# Patient Record
Sex: Male | Born: 1965 | Race: Black or African American | Hispanic: No | Marital: Single
Health system: Midwestern US, Academic
[De-identification: ages and names within clinical notes are randomized; demographics above are authoritative.]

---

## 1998-06-21 ENCOUNTER — Ambulatory Visit (HOSPITAL_COMMUNITY): Admission: RE | Admit: 1998-06-21 | Discharge: 1998-06-21 | Payer: Self-pay | Admitting: *Deleted

## 1998-06-21 ENCOUNTER — Encounter: Payer: Self-pay | Admitting: *Deleted

## 2002-12-31 ENCOUNTER — Ambulatory Visit (HOSPITAL_COMMUNITY): Admission: RE | Admit: 2002-12-31 | Discharge: 2002-12-31 | Payer: Self-pay | Admitting: Family Medicine

## 2007-08-18 ENCOUNTER — Encounter (HOSPITAL_COMMUNITY): Admission: RE | Admit: 2007-08-18 | Discharge: 2007-09-17 | Payer: Self-pay | Admitting: Orthopedic Surgery

## 2013-10-14 MED ORDER — CYCLOBENZAPRINE HCL 10 MG PO TABS
10 MG | ORAL_TABLET | Freq: Three times a day (TID) | ORAL | Status: AC | PRN
Start: 2013-10-14 — End: 2014-10-14

## 2013-10-14 MED ORDER — METHYLPREDNISOLONE (PAK) 4 MG PO TABS
4 MG | ORAL_TABLET | ORAL | Status: DC
Start: 2013-10-14 — End: 2014-02-01

## 2013-10-14 NOTE — Patient Instructions (Signed)
Sciatica: Exercises  Your Care Instructions  Here are some examples of typical rehabilitation exercises for your condition. Start each exercise slowly. Ease off the exercise if you start to have pain.  Your doctor or physical therapist will tell you when you can start these exercises and which ones will work best for you.  When you are not being active, find a comfortable position for rest. Some people are comfortable on the floor or a medium-firm bed with a small pillow under their head and another under their knees. Some people prefer to lie on their side with a pillow between their knees. Don't stay in one position for too long.  Take short walks (10 to 20 minutes) every 2 to 3 hours. Avoid slopes, hills, and stairs until you feel better. Walk only distances you can manage without pain, especially leg pain.  How to do the exercises  Back stretches    1. Get down on your hands and knees on the floor.  2. Relax your head and allow it to droop. Round your back up toward the ceiling until you feel a nice stretch in your upper, middle, and lower back. Hold this stretch for as long as it feels comfortable, or about 15 to 30 seconds.  3. Return to the starting position with a flat back while you are on your hands and knees.  4. Let your back sway by pressing your stomach toward the floor. Lift your buttocks toward the ceiling.  5. Hold this position for 15 to 30 seconds.  6. Repeat 2 to 4 times.  Follow-up care is a key part of your treatment and safety. Be sure to make and go to all appointments, and call your doctor if you are having problems. It's also a good idea to know your test results and keep a list of the medicines you take.   Where can you learn more?   Go to [email or web address] and sign in to your MyChart account. Enter L998 in the Search Health Information box to learn more about "Sciatica: Exercises."    If you do not have an account, please click on the "Sign Up Now" link.       2006-2015 Healthwise, Inc[employer]. Care instructions adapted under license by [facility]. This care instruction is for use with your licensed healthcare professional. If you have questions about a medical condition or this instruction, always ask your healthcare professional. Healthwise, Incorporated disclaims any warranty or liability for your use of this information.  Content Version: 10.6.465758; Current as of: Jun 19, 2013

## 2013-10-14 NOTE — Progress Notes (Signed)
Chief Complaint   Patient presents with   . Established New Doctor   . Lower Back Pain   . Leg Pain     left       HPI: Thomas Stevenson is a 48 yo who presents with 2 1/2 week h/o L sided back pain radiating into his scrotum.  Notes worse  With movement, standing worse, walking worse.  Began after playing basketball.  Notes no incontinence, no dysuria, no weakness or numbness.  Denies specific injury.    He also notes he has mildly elevated blood pressure.  Does not take medication for this and his dad has HTN as well.      ROS: no fever or chills, no headache, no visual changes, no runny nose, no sore throat, no ear pain, no swollen glands or thyroid pain/swelling, no cough, no sob, no chest pain, no palpitation, no abd pain, no n/v/d/c, no urinary frequency or dysuria, no focal weakness or numbness, no joint pain or swelling, + depigmenting rash on skin for 3 years or so, noeasy bleeding or bruising,     No Known Allergies  New Prescriptions    CYCLOBENZAPRINE (FLEXERIL) 10 MG TABLET    Take 1 tablet by mouth every 8 hours as needed for Muscle spasms    METHYLPREDNISOLONE (MEDROL DOSEPACK) 4 MG TABLET    Take 1 tablet by mouth See Admin Instructions Take by mouth as directed.     Current Outpatient Prescriptions   Medication Sig Dispense Refill   . methylPREDNISolone (MEDROL DOSEPACK) 4 MG tablet Take 1 tablet by mouth See Admin Instructions Take by mouth as directed.  21 tablet  0   . cyclobenzaprine (FLEXERIL) 10 MG tablet Take 1 tablet by mouth every 8 hours as needed for Muscle spasms  20 tablet  0     No current facility-administered medications for this visit.       Past Medical History   Diagnosis Date   . Achilles tendon tear      [facility]   . Vitiligo      Past Surgical History   Procedure Laterality Date   . Achilles tendon surgery       Family History   Problem Relation Age of Onset   . Hypertension Father    . Diabetes Father    . Other Mother      History   Substance Use Topics   . Smoking status: Never  Smoker    . Smokeless tobacco: Not on file   . Alcohol Use: 0.0 oz/week     0 Not specified per week      Comment: occassionally       Objective  BP 162/84 mmHg  Pulse 53  Ht  (1.727 m)  Wt 222 lb (100.699 kg)  BMI 33.76 kg/m2  Wt Readings from Last 3 Encounters:   10/14/13 222 lb (100.699 kg)       WDWN in NAD  HEENT: NCAT, PERRL, TM's neg, Canals patent, Nares pink and Patent, Op/OC: pink and patent with good dentition  Neck: no LAD, no TMG, supple and symmetric  Lungs: CTAB BS Equal and Easy, no accessory muscle use  CV: RRR w/o M,R,G, PP2+, no edema  Abd:  BS+, S, ND, NT, no hsm  Neuro:  A&O x 3, CN2-12 grossly intact, Motor/Sensory intact w/o focal deficit, Coordination intact, normal gait  Psych:  Nl Speech and motor activity, no LOA, no FOI, dressed casually in street clothes  Skin:  Depigmenting  rash on face, hands  Musc:  SLR neg B, DTR2+ B prox and distally, motor 5/5 B U/L ext prox and distally      Assessment  Plan    1. Elevated blood pressure reading without diagnosis of hypertension  Will have patient check bp and recheck 1 month  - MyChart Blood Pressure Flowsheet    2. Sciatica neuralgia, left  Will trial steroids, HEP and recheck 1 month  - methylPREDNISolone (MEDROL DOSEPACK) 4 MG tablet; Take 1 tablet by mouth See Admin Instructions Take by mouth as directed.  Dispense: 21 tablet; Refill: 0  - cyclobenzaprine (FLEXERIL) 10 MG tablet; Take 1 tablet by mouth every 8 hours as needed for Muscle spasms  Dispense: 20 tablet; Refill: 0    Thomas Stevenson received counseling on the following healthy behaviors: exercise    Patient given educational materials on Exercise    I have instructed Thomas Stevenson to complete a self tracking handout on Blood Pressures  and instructed them to bring it with them to his next appointment.     Discussed use, benefit, and side effects of prescribed medications.  Barriers to medication compliance addressed.  All patient questions answered.  Pt voiced understanding.     RTC in 1  month

## 2014-02-01 ENCOUNTER — Ambulatory Visit: Admit: 2014-02-01 | Discharge: 2014-02-01 | Payer: PRIVATE HEALTH INSURANCE | Attending: Family Medicine

## 2014-02-01 DIAGNOSIS — J209 Acute bronchitis, unspecified: Secondary | ICD-10-CM

## 2014-02-01 MED ORDER — BENZONATATE 200 MG PO CAPS
200 MG | ORAL_CAPSULE | Freq: Three times a day (TID) | ORAL | Status: AC | PRN
Start: 2014-02-01 — End: 2014-02-08

## 2014-02-01 MED ORDER — METHYLPREDNISOLONE (PAK) 4 MG PO TABS
4 MG | ORAL_TABLET | ORAL | Status: AC
Start: 2014-02-01 — End: ?

## 2014-02-01 MED ORDER — AZITHROMYCIN 250 MG PO TABS
250 MG | PACK | ORAL | Status: AC
Start: 2014-02-01 — End: 2014-02-11

## 2014-02-01 NOTE — Progress Notes (Signed)
Chief Complaint   Patient presents with   . Congestion   . Pharyngitis   . Cough       HPI: Thomas Stevenson presents with 2 week h/o cough, sinus congestion and now has developed sore throat.      ROS: no fever or chills,  no sob, no chest pain, no palpitation,     No Known Allergies  New Prescriptions    AZITHROMYCIN (ZITHROMAX) 250 MG TABLET    As directed    BENZONATATE (TESSALON) 200 MG CAPSULE    Take 1 capsule by mouth every 8 hours as needed for Cough    METHYLPREDNISOLONE (MEDROL DOSEPACK) 4 MG TABLET    Take 1 tablet by mouth See Admin Instructions Take by mouth as directed.     Current Outpatient Prescriptions   Medication Sig Dispense Refill   . azithromycin (ZITHROMAX) 250 MG tablet As directed 1 packet 0   . methylPREDNISolone (MEDROL DOSEPACK) 4 MG tablet Take 1 tablet by mouth See Admin Instructions Take by mouth as directed. 21 tablet 0   . benzonatate (TESSALON) 200 MG capsule Take 1 capsule by mouth every 8 hours as needed for Cough 30 capsule 0   . cyclobenzaprine (FLEXERIL) 10 MG tablet Take 1 tablet by mouth every 8 hours as needed for Muscle spasms 20 tablet 0     No current facility-administered medications for this visit.       Past Medical History   Diagnosis Date   . [facility]   . Vit[facility]igo          Objective  BP 127/86 mmHg  Pulse 64  Temp(Src) 98.7 F (37.1 C) (Oral)  Ht 5\' 8"  (1.727 m)  Wt 221 lb (100.245 kg)  BMI 33.61 kg/m2  Wt Readings from Last 3 Encounters:   02/01/14 221 lb (100.245 kg)   10/14/13 222 lb (100.699 kg)       WDWN in NAD  HEENT: NCAT, PERRL, TM's neg, Canals patent, Nares pink and Patent, Op/OP:  Erythematous w/o exudate with good dentition  Lungs: few rhonchi, BS Equal and Easy, no accessory muscle use  CV: RRR w/o M,R,G, PP2+, no edema      Assessment  Plan    1. Acute bronchitis, unspecified organism  Will tx with abx and recheck inb 1- 2 weeks  - azithromycin (ZITHROMAX) 250 MG tablet; As directed  Dispense: 1 packet; Refill: 0  -  methylPREDNISolone (MEDROL DOSEPACK) 4 MG tablet; Take 1 tablet by mouth See Admin Instructions Take by mouth as directed.  Dispense: 21 tablet; Refill: 0  - benzonatate (TESSALON) 200 MG capsule; Take 1 capsule by mouth every 8 hours as needed for Cough  Dispense: 30 capsule; Refill: 0  Discussed use, benefit, and side effects of prescribed medications.  Barriers to medication compliance addressed.  All patient questions answered.  Pt voiced understanding.   RTC inb 1-2 weeks.

## 2014-02-03 MED ORDER — VALACYCLOVIR HCL 1 G PO TABS
1 g | ORAL_TABLET | Freq: Two times a day (BID) | ORAL | Status: DC
Start: 2014-02-03 — End: 2014-06-04

## 2014-02-03 NOTE — Telephone Encounter (Signed)
Please call:  Done.

## 2014-02-03 NOTE — Telephone Encounter (Signed)
Patient has cold sore. Stated that he usually takes Valtrex that was once prescribed by another physician. Pt would like Valtrex rx'ed to him and sent to [facility] thanks.

## 2014-02-03 NOTE — Telephone Encounter (Signed)
Patient notified

## 2014-06-04 MED ORDER — VALACYCLOVIR HCL 1 G PO TABS
1 g | ORAL_TABLET | Freq: Two times a day (BID) | ORAL | Status: AC
Start: 2014-06-04 — End: 2014-06-14

## 2014-06-04 NOTE — Telephone Encounter (Signed)
Pt called back & [facility] at [phone].

## 2014-06-04 NOTE — Telephone Encounter (Signed)
Please phone in to pharmacy for me.  Thanks.

## 2014-06-04 NOTE — Telephone Encounter (Signed)
Pt called to ask for an Rx for cold sores.  He said he has had this medication before.    [place]  Call pt and let him know.    [facility] [address] [place] [address], phone [phone]

## 2014-06-04 NOTE — Telephone Encounter (Signed)
Tried to call in but that [facility] not [facility] a [facility].  Called pt he will try to locate a pharmacy for us to be able to call this in.

## 2014-11-15 ENCOUNTER — Ambulatory Visit: Admit: 2014-11-15 | Discharge: 2014-11-15 | Payer: PRIVATE HEALTH INSURANCE | Attending: Family Medicine

## 2014-11-15 DIAGNOSIS — M79672 Pain in left foot: Secondary | ICD-10-CM

## 2014-11-15 MED ORDER — METRONIDAZOLE 500 MG PO TABS
500 MG | ORAL_TABLET | Freq: Two times a day (BID) | ORAL | 0 refills | Status: AC
Start: 2014-11-15 — End: 2014-11-22

## 2014-11-15 MED ORDER — CIPROFLOXACIN HCL 500 MG PO TABS
500 MG | ORAL_TABLET | Freq: Two times a day (BID) | ORAL | 0 refills | Status: AC
Start: 2014-11-15 — End: 2014-11-22

## 2014-11-15 NOTE — Patient Instructions (Signed)
Heel Pain: Care Instructions  Your Care Instructions  You can have heel pain from an injury or from everyday overuse, such as running or walking a lot. Plantar fasciitis is the most common cause of heel pain. In this condition, the bottom of your foot from the front of the heel to the base of the toes is sore and hard to walk on.  Your heel can get better with rest, anti-inflammatory pain medicines, and stretching exercises.  Follow-up care is a key part of your treatment and safety. Be sure to make and go to all appointments, and call your doctor if you are having problems. It's also a good idea to know your test results and keep a list of the medicines you take.  How can you care for yourself at home?   Rest your feet often. Reduce your activity to a level that lets you avoid pain. If possible, do not run or walk on hard surfaces.   Take anti-inflammatory medicines to reduce heel pain. These include ibuprofen (Advil, Motrin) and naproxen (Aleve). Read and follow all instructions on the label.   Put ice or a cold pack on your heel for 10 to 20 minutes at a time. Try to do this every 1 to 2 hours for the next 3 days (when you are awake). Put a thin cloth between the ice and your skin.   If ice isn't helping after 2 or 3 days, try heat, such as a heating pad set on low.   If your doctor says it is okay, try these calf stretches. Tight calf muscles can cause heel pain or make it worse.   Stand about 1 foot from a wall. Place the palms of both hands against the wall at chest level and lean forward against the wall. Put the leg you want to stretch about a step behind your other leg. Keep your back heel on the floor and bend your front knee until you feel a stretch in the back leg. Hold this position for 15 to 30 seconds. Repeat the exercise 2 to 4 times a session. Do 3 to 4 sessions a day.   Sit down on the floor or a mat with your feet stretched in front of you. Roll up a towel lengthwise, and loop it over the  ball of your foot. Holding the towel at both ends, gently pull the towel toward you to stretch your foot. Hold this position for 15 to 30 seconds. Repeat the exercise 2 to 4 times a session. Do 3 to 4 sessions a day.   Wear a night splint if your doctor suggests it. A night splint holds your foot with the toes pointed up. This position gives the bottom of your foot a constant, gentle stretch.   Wear shoes with good arch support. Athletic shoes or shoes with a well-cushioned sole are good choices.   Try a heel lift, heel cup or shoe insert (orthotic) to help cushion your heel. You can buy these at many shoe stores. Use them in both shoes, even if only one foot hurts.   Maintain a healthy weight. This puts less strain on your feet.  When should you call for help?  Call your doctor now or seek immediate medical care if:   You have heel pain with fever, redness, or warmth in your heel.   You have numbness or tingling in your heel.  Watch closely for changes in your health, and be sure to contact your doctor if:     You cannot put weight on the sore foot.   Your heel pain lasts more than 2 weeks.  Where can you learn more?  Go to [email or web address] and sign in to your MyChart account. Enter S299 in the Search Health Information box to learn more about "Heel Pain: Care Instructions."    If you do not have an account, please click on the "Sign Up Now" link.   2006-2016 Healthwise, Incorporated. Care instructions adapted under license by [facility]. This care instruction is for use with your licensed healthcare professional. If you have questions about a medical condition or this instruction, always ask your healthcare professional. Healthwise, Incorporated disclaims any warranty or liability for your use of this information.  Content Version: 10.9.538570; Current as of: December 18, 2013

## 2014-11-15 NOTE — Progress Notes (Signed)
Chief Complaint   Patient presents with   . Other     diarrhea after eating   . Foot Pain     left heel       HPI: Arlys JohnBrian rtc with 3 week h/o loose brown liquid stool that has not improved and is associated with gurgling no sig abd pain, no n/v  Notes [place] developed [place].    Notes he also has several week h/o L heel pain worse in the morning, worse with exercise 5/10 and constant through the day. Has tried stretching.      ROS: no fever or chills, no cough, no sob, no chest pain, no palpitation,    No Known Allergies  New Prescriptions    CIPROFLOXACIN (CIPRO) 500 MG TABLET    Take 1 tablet by mouth 2 times daily for 7 days    METRONIDAZOLE (FLAGYL) 500 MG TABLET    Take 1 tablet by mouth 2 times daily for 7 days     Current Outpatient Prescriptions   Medication Sig Dispense Refill   . ciprofloxacin (CIPRO) 500 MG tablet Take 1 tablet by mouth 2 times daily for 7 days 14 tablet 0   . metroNIDAZOLE (FLAGYL) 500 MG tablet Take 1 tablet by mouth 2 times daily for 7 days 14 tablet 0   . methylPREDNISolone (MEDROL DOSEPACK) 4 MG tablet Take 1 tablet by mouth See Admin Instructions Take by mouth as directed. 21 tablet 0     No current facility-administered medications for this visit.        Past Medical History   Diagnosis Date   . [place]      [facility]   . Vitiligo          Objective   Visit Vitals   . BP 127/87 (Site: Right Arm, Position: Sitting, Cuff Size: Large Adult)   . Pulse 54   . Ht 5\' 8"  (1.727 m)   . Wt 219 lb (99.3 kg)   . BMI 33.3 kg/m2     Wt Readings from Last 3 Encounters:   11/15/14 219 lb (99.3 kg)   02/01/14 221 lb (100.2 kg)   10/14/13 222 lb (100.7 kg)       WDWN in NAD  Lungs: CTAB BS Equal and Easy, no accessory muscle use  CV: RRR w/o M,R,G, PP2+, no edema  Abd:  BS+, S, ND, mild LLQ and RLQ tenderness, no hsm  Psych:  Nl Speech and motor activity, no LOA, no FOI, dressed casually in street clothes  Musc:  No tenderness L heel, no discoloration or  swelling.     Assessment   Plan     1. Pain of left heel  Uncertain dx: Plantar fasciitis,  Injury etc, will trial stretching and foot orthotics, rtc inb 2 weeks.     2. Diarrhea of presumed infectious origin  Will trial meds and recheck 1-2 weeks inb sooner if worse.   - ciprofloxacin (CIPRO) 500 MG tablet; Take 1 tablet by mouth 2 times daily for 7 days  Dispense: 14 tablet; Refill: 0  - metroNIDAZOLE (FLAGYL) 500 MG tablet; Take 1 tablet by mouth 2 times daily for 7 days  Dispense: 14 tablet; Refill: 0    Discussed use, benefit, and side effects of prescribed medications.  Barriers to medication compliance addressed.  All patient questions answered.  Pt voiced understanding.       RTC inb 1-2 weeks.

## 2015-06-22 ENCOUNTER — Other Ambulatory Visit: Payer: PRIVATE HEALTH INSURANCE

## 2015-06-22 ENCOUNTER — Ambulatory Visit: Admit: 2015-06-22 | Discharge: 2015-06-22 | Payer: PRIVATE HEALTH INSURANCE

## 2015-06-22 DIAGNOSIS — R9431 Abnormal electrocardiogram [ECG] [EKG]: Secondary | ICD-10-CM

## 2015-06-22 LAB — AMBIG ABBREV LP DEFAULT

## 2015-06-22 LAB — RENAL FUNCTION PANEL W/EGFR
Albumin: 4.4 g/dL (ref 3.5–5.5)
BUN/Creatinine Ratio: 16 (ref 9–20)
BUN: 16 mg/dL (ref 6–24)
CO2: 23 mmol/L (ref 18–29)
Calcium: 9.3 mg/dL (ref 8.7–10.2)
Chloride: 100 mmol/L (ref 96–106)
Creatinine: 1.01 mg/dL (ref 0.76–1.27)
GFR MDRD Af Amer: 101 mL/min/{1.73_m2} (ref >59)
Glucose: 91 mg/dL (ref 65–99)
Phosphorus: 3.3 mg/dL (ref 2.5–4.5)
Potassium: 4.4 mmol/L (ref 3.5–5.2)
Sodium: 140 mmol/L (ref 134–144)
eGFR Non-Afr. American: 88 mL/min/{1.73_m2} (ref >59)

## 2015-06-22 LAB — HEMOGLOBIN A1C: Hemoglobin A1C: 5.8 % (ref 4.8–5.6)

## 2015-06-22 LAB — LIPID PANEL
Cholesterol, Total: 214 mg/dL — ABNORMAL HIGH (ref 100–199)
HDL: 67 mg/dL (ref >39)
LDL Calculated: 132 mg/dL — ABNORMAL HIGH (ref 0–99)
Triglycerides: 76 mg/dL (ref 0–149)
VLDL Cholesterol Cal: 15 mg/dL (ref 5–40)

## 2015-06-22 LAB — TSH: TSH: 1.99 u[IU]/mL (ref 0.450–4.500)

## 2015-06-22 LAB — AMBIG ABBREV RP 10 DEFAULT

## 2015-06-22 NOTE — Unmapped (Signed)
Subjective:       Patient ID: Nicholas Zavala is a 50 y.o. male.    Chief Complaint:    HPI  Middle aged male with no prior sign PMHx who is referred to clinic for an abnormal ECG.  He has no family history of CAD.  He had an ECG as part of a pre-employment work up that was abnormal demonstrating criteria for LVH.  He is active, walking on the treadmill for one hour, three times per week and plays basketball on the weekend.  Today, he is without complaint.    Patient denies chest pain, dyspnea, nausea, vomiting, diaphoresis.  Patient denies palpitations, presyncope.  Patient denies PND, orthopnea, LE edema.      Extensive 11 point review of symptoms reviewed/scanned into record.    DIAGNOSTICS:  No data       Histories:   He has no past medical history on file.    He has no past surgical history on file.    His family history is not on file.    He reports that he has never smoked. He does not have any smokeless tobacco history on file. He reports that he drinks alcohol. He reports that he does not use illicit drugs.    ROS:   ROS    Allergies:   Review of patient's allergies indicates no known allergies.    Medications:     Outpatient Encounter Prescriptions as of 06/22/2015   Medication Sig Dispense Refill   ??? multivitamin capsule Take 1 capsule by mouth daily.       No facility-administered encounter medications on file as of 06/22/2015.        Objective:       Blood pressure 138/90, pulse 52, height 5' 8 (1.727 m), weight 215 lb (97.523 kg), SpO2 98 %.    Physical Exam   Constitutional: He appears healthy. No distress.   HENT:   Nose: Nose normal. No nasal discharge.   Neck: No JVD present.   No HJR  No carotid bruit   Cardiovascular: Normal rate, regular rhythm, S1 normal and S2 normal.   No extrasystoles are present. Exam reveals no gallop.    No murmur heard.  Pulses:       Radial pulses are 2+ on the right side, and 2+ on the left side.        Dorsalis pedis pulses are 2+ on the right side, and 2+ on the left  side.        Posterior tibial pulses are 2+ on the right side, and 2+ on the left side.   Pulmonary/Chest: Breath sounds normal. He has no wheezes. He has no rales. He exhibits no tenderness.   Abdominal: Soft. Bowel sounds are normal. He exhibits no distension. There is no tenderness.   Musculoskeletal: He exhibits no edema.   Neurological: He is alert and oriented to person, place, and time.   Skin: Skin is warm and dry.       Lab Review:   CBC:    No results found for: HGB, HCT, PLT, WBC, MCV, MCH    RENAL:  No results found for: NA, K, CL, CO2, BUN, CREATININE, GLUCOSE, PHOS, ALBUMIN, MICROALBUR, MALB24HUR, CALCIUM    LIVER:    No results found for: AST, ALT, BILITOT, ALBUMIN, ALKPHOS    LIPIDS:    No results found for: CHOLTOT, LDL, HDL, TRIG    THYROID:    No results found for: TSH, FREET4, T3UP  DIABETES:    No results found for: HGBA1C, FRUCT, GLUCOSE, CREATININE    Body mass index is 32.7 kg/(m^2).    The ASCVD Risk score Denman George DC Jr., et al., 2013) failed to calculate for the following reasons:    Cannot find a previous HDL lab    Cannot find a previous total cholesterol lab    EKG Interpretation:  No EKG done at this visit.  ECG from PCP clinic demonstrates sinus bradycardia, LVH with repolarization changes, NSTWC          Assessment:   Middle aged male with no sign PMHx and ECG concerning for LVH.  He does state that his PCP mentioned he has had high pressures in the past.  However, he has never been treated with antihypertensive.  Today, he is hemodynamically stable without complaint.    Plan:   ? HTN/LVH- check echo, keep BP diary; give script for BP monitor  ASCVD risk- check risk stratification labs today  RTC after echo  Noncardiac complaints per PCP    I spent 35 min in direct patient care as well as chart review.

## 2015-07-04 ENCOUNTER — Ambulatory Visit: Admit: 2015-07-04 | Discharge: 2015-07-04 | Payer: PRIVATE HEALTH INSURANCE | Attending: Family Medicine

## 2015-07-04 DIAGNOSIS — R9431 Abnormal electrocardiogram [ECG] [EKG]: Secondary | ICD-10-CM

## 2015-07-04 NOTE — Progress Notes (Signed)
Chief Complaint   Patient presents with   . Other     Per pt had an abnormal EKG at Pre Employment Physical       HPI: Harold HedgeBrian Winget presents for evaluation and management of abnormal ecg.  He notes he got a pre-employment physical with abnormal ECG.  He notes he plays basketball regularly, runs on stair stepper 2-3 x a week for an hour w/o chest pain.  He reports his cholesterol was high on recent physical (LDL 132).  Otherwise feels well.             ROS: no fever or chills, no cough, no sob, no chest pain, no palpitation, no abd pain, no n/v/d/c, no urinary frequency or dysuria,     No Known Allergies  New Prescriptions    No medications on file     Current Outpatient Prescriptions   Medication Sig Dispense Refill   . methylPREDNISolone (MEDROL DOSEPACK) 4 MG tablet Take 1 tablet by mouth See Admin Instructions Take by mouth as directed. 21 tablet 0     No current facility-administered medications for this visit.        Past Medical History:   Diagnosis Date   . [place]     [facility]   . Pure hypercholesterolemia 07/04/2015   . Vitiligo          Objective   BP 139/84  Pulse 50  Resp 20  Ht 5\' 8"  (1.727 m)  Wt 216 lb (98 kg)  BMI 32.84 kg/m2  Wt Readings from Last 3 Encounters:   07/04/15 216 lb (98 kg)   11/15/14 219 lb (99.3 kg)   02/01/14 221 lb (100.2 kg)       WDWN in NAD  Lungs: CTAB BS Equal and Easy, no accessory muscle use  CV: RRR w/o M,R,G, PP2+, no edema  Abd:  BS+, S, ND, NT, no hsm  Psych: Judgement and insight are intact, Nl Speech and motor activity, no LOA, no FOI, dressed casually in street clothes      ECG: NSR w LVH and repolarization ab ant/lat      Chemistry    No results found for: NA, K, CL, CO2, BUN, CREATININE No results found for: CALCIUM, ALKPHOS, AST, ALT, BILITOT       No results found for: WBC, HGB, HCT, MCV, PLT  No results found for: LABA1C  No results found for: EAG  No results found for: LABA1C  No components found for: CHLPL  No results found for: TRIG  No  results found for: HDL  No results found for: LDLCALC  No results found for: LABVLDL      Assessment   Plan     1. Abnormal EKG  Will check Echo for LVH, counseled on BP control and mediteranean diet,  - EKG 12 Lead  - ECHO Complete 2D W Doppler W Color    2. Pure hypercholesterolemia  Work on diet and recheck 3 months.     Arlys JohnBrian received counseling on the following healthy behaviors: nutrition and exercise    Patient given educational materials on Nutrition and Exercise    I have instructed Arlys JohnBrian to complete a self tracking handout on Blood Pressures  and instructed them to bring it with them to his next appointment.     Discussed use, benefit, and side effects of prescribed medications.  Barriers to medication compliance addressed.  All patient questions answered.  Pt voiced understanding.  RTC in 3 month

## 2015-07-05 ENCOUNTER — Inpatient Hospital Stay: Payer: PRIVATE HEALTH INSURANCE

## 2015-07-13 ENCOUNTER — Inpatient Hospital Stay: Admit: 2015-07-13 | Attending: Family Medicine

## 2015-07-13 DIAGNOSIS — R9431 Abnormal electrocardiogram [ECG] [EKG]: Secondary | ICD-10-CM

## 2015-07-13 LAB — ECHOCARDIOGRAM COMPLETE 2D W DOPPLER W COLOR: Left Ventricular Ejection Fraction: 63

## 2015-07-13 NOTE — Progress Notes (Signed)
Call Echocardiogram was normal.

## 2015-07-20 NOTE — Telephone Encounter (Signed)
States pt came in for medical clearance for work, but was sent for an echo first. States office should have received results. Requesting clearance. Please advise. Please call Arlys JohnBrian back at [phone]

## 2015-07-20 NOTE — Telephone Encounter (Signed)
Patient requesting echo results to be faxed over [facility]. Fax number is [phone].

## 2015-07-20 NOTE — Telephone Encounter (Signed)
Faxed as requested

## 2015-07-20 NOTE — Telephone Encounter (Signed)
Called pt & LMOM for him to call office that we have his results & Dr Zachery DauerBarnes can write a letter clearing hi for work if he needs it.

## 2015-07-20 NOTE — Telephone Encounter (Signed)
Please call: His echocardiogram was normal. If he needs a letter please type I upsetting that I will clear him for work without restrictions     thanks

## 2015-07-25 NOTE — Telephone Encounter (Signed)
I refaxed the echo for the patient and called and let him know

## 2015-07-25 NOTE — Telephone Encounter (Signed)
Had echo. States results were supposed to be forwarded to Dr Derl Barrowobert Hollis. Their office is stating they never received the results. Pt is calling to find out if this can be done. Please advise. Please call Arlys JohnBrian back at [phone]-- states he provided all information to have results faxed with last phone call

## 2016-05-21 ENCOUNTER — Encounter: Payer: Self-pay | Admitting: Podiatry

## 2016-05-21 ENCOUNTER — Ambulatory Visit (INDEPENDENT_AMBULATORY_CARE_PROVIDER_SITE_OTHER): Payer: 59

## 2016-05-21 ENCOUNTER — Ambulatory Visit: Payer: 59

## 2016-05-21 ENCOUNTER — Ambulatory Visit (INDEPENDENT_AMBULATORY_CARE_PROVIDER_SITE_OTHER): Payer: 59 | Admitting: Podiatry

## 2016-05-21 VITALS — BP 135/86 | HR 51

## 2016-05-21 DIAGNOSIS — S93692A Other sprain of left foot, initial encounter: Secondary | ICD-10-CM | POA: Diagnosis not present

## 2016-05-21 DIAGNOSIS — R52 Pain, unspecified: Secondary | ICD-10-CM

## 2016-05-21 MED ORDER — MELOXICAM 15 MG PO TABS: 15.0000 mg | ORAL_TABLET | Freq: Every day | ORAL | 2 refills | Status: AC

## 2016-05-21 MED ORDER — MELOXICAM 15 MG PO TABS: 15.0000 mg | ORAL_TABLET | Freq: Every day | ORAL | 2 refills | Status: DC

## 2016-05-21 MED ORDER — HYDROCODONE-ACETAMINOPHEN 5-325 MG PO TABS: 1.0000 | ORAL_TABLET | Freq: Four times a day (QID) | ORAL | 0 refills | Status: DC | PRN

## 2016-05-21 NOTE — Progress Notes (Signed)
   Subjective:    Patient ID: Jermaine Martin, male    DOB: [birth year], 51 y.o.   MRN: [id number]  HPI 51 year old male presents the office today for concerns of left foot injury. He states his playing basketball yesterday am ongoing across the Court he felt a sudden pain in the bottom of his heel on his left side. He states he is history of right Achilles rupture but just did not feel like that. He states that immediately he was unable to wait to the bottom of his foot. He denies any sudden impact. He's been using crutches as His foot. He is a no other treatment. No other complaints.   Review of Systems  All other systems reviewed and are negative.      Objective:   Physical Exam General: AAO x3, NAD  Dermatological: Skin is warm, dry and supple bilateral. Nails x 10 are well manicured; remaining integument appears unremarkable at this time. There are no open sores, no preulcerative lesions, no rash or signs of infection present.  Vascular: Dorsalis Pedis artery and Posterior Tibial artery pedal pulses are 2/4 bilateral with immedate capillary fill time. There is no pain with calf compression, swelling, warmth, erythema.   Neruologic: Grossly intact via light touch bilateral. Vibratory intact via tuning fork bilateral. Protective threshold with Semmes Wienstein monofilament intact to all pedal sites bilateral. Negative Tinel's sign.  Musculoskeletal: There is tenderness to palpation on the medial band plantar fascia at the insertion of the calcaneus. There is mild edema to the area. I'm able to palpate fully the plantar fascia. This could be due to partial rupture versus swelling. Is no erythema or increase in warmth is no lateral compression pain of the calcaneus. There is no pain along the course or insertion of the Achilles tendon and Thompson's test is negative. There is no other areas of tenderness identified bilaterally. Muscular strength 5/5 in all groups tested bilateral.  Gait:  Unassisted, Nonantalgic.     Assessment & Plan:  51 year old male left foot likely plantar fascial rupture -Treatment options discussed including all alternatives, risks, and complications -Etiology of symptoms were discussed -X-rays were obtained and reviewed with the patient. No evidence of acute fracture identified. -Cam boot was dispensed. Once all times. -He has crutches. Remain nonweightbearing to is able to put weight on his foot. -Ice and elevation -No exercise -Rx Vicodin -RTC 2 weeks. If not improving will order an MRI  Ovid Curd, DPM

## 2016-06-04 ENCOUNTER — Encounter: Payer: Self-pay | Admitting: Podiatry

## 2016-06-04 ENCOUNTER — Ambulatory Visit (INDEPENDENT_AMBULATORY_CARE_PROVIDER_SITE_OTHER): Payer: 59 | Admitting: Podiatry

## 2016-06-04 DIAGNOSIS — S93692D Other sprain of left foot, subsequent encounter: Secondary | ICD-10-CM

## 2016-06-04 NOTE — Progress Notes (Signed)
Subjective: 51 year old male presents the office today for follow-up evaluation of left foot plantar fasciitis rupture. He states he is doing better and is able to walk in the boot these been tried going for distances without the boot and doing better. His pain level maxes 5/10. He is asking to return to work this week. Denies any systemic complaints such as fevers, chills, nausea, vomiting. No acute changes since last appointment, and no other complaints at this time.   Objective: AAO x3, NAD DP/PT pulses palpable bilaterally, CRT less than 3 seconds There is minimal discomfort along the medial band of plantar fascia within the arch of the foot and this is significantly improved compared to what it was last appointment. There is no pain with lateral compression of the calcaneus. There is no pain on the Achilles tendon and Thompson test negative. There is no other areas of tenderness. There is no overlying erythema or increase in warmth. No open lesions or pre-ulcerative lesions.  No pain with calf compression, swelling, warmth, erythema  Assessment: Left foot partial plantar fascial tear likely.  Plan: -All treatment options discussed with the patient including all alternatives, risks, complications.  -This time his symptoms are improved. We will try to return to regular shoe and I dispensed plantar fascial brace to see how this will help as well. Also reconstruct the wear drainage shoe and he can return to work this week if able. However if he goes back to work Ms. pain he is to remain in a cam boot unfortunately cannot work in the boot however. -Continue ice as well as plantar fascial stretching exercises. -Follow-up 2 weeks or sooner if needed. If symptoms continue steroid injection at next appointment likely.  -Patient encouraged to call the office with any questions, concerns, change in symptoms.   Ovid CurdMatthew Fernanda Twaddell, DPM

## 2016-06-05 ENCOUNTER — Telehealth: Payer: Self-pay | Admitting: *Deleted

## 2016-06-05 NOTE — Telephone Encounter (Addendum)
Pt states he saw Dr. Ardelle AntonWagoner 06/04/2016, and needs a return to work note 06/07/2016 and the status for faxed paperwork for job description and restrictions. Left message informing pt the letter would be available for fax or to be picked up in the [place] office.

## 2016-06-06 ENCOUNTER — Encounter: Payer: Self-pay | Admitting: *Deleted

## 2016-06-06 NOTE — Telephone Encounter (Signed)
He can return to work in a regular shoe. Please allow him to sit and ice his foot when needed.

## 2016-06-08 ENCOUNTER — Telehealth: Payer: Self-pay | Admitting: Podiatry

## 2016-06-08 NOTE — Telephone Encounter (Signed)
Patient called about job description paperwork. Says its supposed to faxed to his job but they are stating they never got it. Left a new fax number and contact to fax it to. That information is: fax number [phone]  Attn: Marlana SalvageLisa Shuller

## 2016-06-21 ENCOUNTER — Ambulatory Visit: Payer: 59 | Admitting: Podiatry

## 2017-02-13 ENCOUNTER — Ambulatory Visit: Payer: 59 | Admitting: Urology

## 2017-02-13 DIAGNOSIS — N5201 Erectile dysfunction due to arterial insufficiency: Secondary | ICD-10-CM | POA: Diagnosis not present

## 2017-02-13 DIAGNOSIS — Z308 Encounter for other contraceptive management: Secondary | ICD-10-CM | POA: Diagnosis not present

## 2017-10-02 ENCOUNTER — Ambulatory Visit: Payer: 59 | Admitting: Urology

## 2020-06-03 ENCOUNTER — Emergency Department (HOSPITAL_COMMUNITY)
Admission: EM | Admit: 2020-06-03 | Discharge: 2020-06-04 | Disposition: A | Discharge status: Home / Self Care | Payer: 59 | Attending: Emergency Medicine | Admitting: Emergency Medicine

## 2020-06-03 ENCOUNTER — Encounter (HOSPITAL_COMMUNITY): Payer: Self-pay | Admitting: Emergency Medicine

## 2020-06-03 ENCOUNTER — Emergency Department (HOSPITAL_COMMUNITY): Payer: 59

## 2020-06-03 DIAGNOSIS — R1031 Right lower quadrant pain: Secondary | ICD-10-CM | POA: Diagnosis present

## 2020-06-03 DIAGNOSIS — R110 Nausea: Secondary | ICD-10-CM | POA: Insufficient documentation

## 2020-06-03 DIAGNOSIS — N200 Calculus of kidney: Secondary | ICD-10-CM

## 2020-06-03 LAB — COMPREHENSIVE METABOLIC PANEL
ALT: 27 U/L (ref 0–44)
AST: 28 U/L (ref 15–41)
Albumin: 4.2 g/dL (ref 3.5–5.0)
Alkaline Phosphatase: 87 U/L (ref 38–126)
Anion gap: 8 (ref 5–15)
BUN: 13 mg/dL (ref 6–20)
CO2: 26 mmol/L (ref 22–32)
Calcium: 9.6 mg/dL (ref 8.9–10.3)
Chloride: 105 mmol/L (ref 98–111)
Creatinine, Ser: 1.39 mg/dL — ABNORMAL HIGH (ref 0.61–1.24)
GFR, Estimated: >60 mL/min (ref >60)
Glucose, Bld: 139 mg/dL — ABNORMAL HIGH (ref 70–99)
Potassium: 4 mmol/L (ref 3.5–5.1)
Sodium: 139 mmol/L (ref 135–145)
Total Bilirubin: 0.7 mg/dL (ref 0.3–1.2)
Total Protein: 7 g/dL (ref 6.5–8.1)

## 2020-06-03 LAB — CBC WITH DIFFERENTIAL/PLATELET
Abs Immature Granulocytes: 0.04 10*3/uL (ref 0.00–0.07)
Basophils Absolute: 0 10*3/uL (ref 0.0–0.1)
Basophils Relative: 0 %
Eosinophils Absolute: 0 10*3/uL (ref 0.0–0.5)
Eosinophils Relative: 0 %
HCT: 44.4 % (ref 39.0–52.0)
Hemoglobin: 14.1 g/dL (ref 13.0–17.0)
Immature Granulocytes: 1 %
Lymphocytes Relative: 9 %
Lymphs Abs: 0.7 10*3/uL (ref 0.7–4.0)
MCH: 27.8 pg (ref 26.0–34.0)
MCHC: 31.8 g/dL (ref 30.0–36.0)
MCV: 87.4 fL (ref 80.0–100.0)
Monocytes Absolute: 0.4 10*3/uL (ref 0.1–1.0)
Monocytes Relative: 5 %
Neutro Abs: 6.8 10*3/uL (ref 1.7–7.7)
Neutrophils Relative %: 85 %
Platelets: 146 10*3/uL — ABNORMAL LOW (ref 150–400)
RBC: 5.08 MIL/uL (ref 4.22–5.81)
RDW: 13.8 % (ref 11.5–15.5)
WBC: 7.9 10*3/uL (ref 4.0–10.5)
nRBC: 0 % (ref 0.0–0.2)

## 2020-06-03 LAB — URINALYSIS, ROUTINE W REFLEX MICROSCOPIC
Bilirubin Urine: NEGATIVE
Glucose, UA: NEGATIVE mg/dL
Hgb urine dipstick: NEGATIVE
Ketones, ur: 20 mg/dL — AB
Leukocytes,Ua: NEGATIVE
Nitrite: NEGATIVE
Protein, ur: NEGATIVE mg/dL
Specific Gravity, Urine: 1.025 (ref 1.005–1.030)
pH: 7 (ref 5.0–8.0)

## 2020-06-03 LAB — LIPASE, BLOOD: Lipase: 27 U/L (ref 11–51)

## 2020-06-03 MED ORDER — SODIUM CHLORIDE 0.9 % IV BOLUS
1000.0000 mL | Freq: Once | INTRAVENOUS | Status: AC
  Administered 2020-06-03: 1000 mL via INTRAVENOUS

## 2020-06-03 MED ORDER — KETOROLAC TROMETHAMINE 30 MG/ML IJ SOLN
30.0000 mg | Freq: Once | INTRAMUSCULAR | Status: AC
  Administered 2020-06-03: 30 mg via INTRAVENOUS
  Filled 2020-06-03: qty 1

## 2020-06-03 MED ORDER — OXYCODONE-ACETAMINOPHEN 5-325 MG PO TABS
1.0000 | ORAL_TABLET | Freq: Once | ORAL | Status: AC
  Administered 2020-06-03: 1 via ORAL
  Filled 2020-06-03: qty 1

## 2020-06-03 MED ORDER — TAMSULOSIN HCL 0.4 MG PO CAPS: 0.4000 mg | ORAL_CAPSULE | Freq: Every day | ORAL | 0 refills | Status: AC

## 2020-06-03 MED ORDER — TAMSULOSIN HCL 0.4 MG PO CAPS: 0.4000 mg | ORAL_CAPSULE | Freq: Every day | ORAL | 0 refills | Status: DC

## 2020-06-03 MED ORDER — HYDROCODONE-ACETAMINOPHEN 5-325 MG PO TABS: 1.0000 | ORAL_TABLET | Freq: Four times a day (QID) | ORAL | 0 refills | Status: AC | PRN

## 2020-06-03 MED ORDER — ONDANSETRON 4 MG PO TBDP: 4.0000 mg | ORAL_TABLET | Freq: Three times a day (TID) | ORAL | 0 refills | Status: DC | PRN

## 2020-06-03 MED ORDER — ONDANSETRON 4 MG PO TBDP: 4.0000 mg | ORAL_TABLET | Freq: Three times a day (TID) | ORAL | 0 refills | Status: AC | PRN

## 2020-06-03 MED ORDER — IOHEXOL 300 MG/ML  SOLN
75.0000 mL | Freq: Once | INTRAMUSCULAR | Status: AC | PRN
  Administered 2020-06-03: 75 mL via INTRAVENOUS

## 2020-06-03 MED ORDER — ONDANSETRON 4 MG PO TBDP
4.0000 mg | ORAL_TABLET | Freq: Once | ORAL | Status: AC
  Administered 2020-06-03: 4 mg via ORAL
  Filled 2020-06-03: qty 1

## 2020-06-03 NOTE — ED Provider Notes (Signed)
Emergency Medicine Provider Triage Evaluation Note  Jermaine Martin , a 55 y.o. male  was evaluated in triage.  Pt complains of right lower quadrant abdominal pain.  Symptoms started today after he had been at work for a few hours.  Associated nausea and dry heaving.  No hematuria, dysuria or flank pain.  No prior abdominal surgeries  Review of Systems  Positive: Abdominal pain, nausea, vomiting Negative: Fever, hematuria, flank pain, chest pain  Physical Exam  BP (!) 160/100 (BP Location: Right Arm)   Pulse (!) 58   Temp 98.4 F (36.9 C) (Oral)   Resp (!) 24   SpO2 100%  Gen:   Awake, appears very uncomfortable Resp:  Normal effort  MSK:   Moves extremities without difficulty  Other:  Tenderness present in the right lower quadrant without guarding  Medical Decision Making  Medically screening exam initiated at 1:34 PM.  Appropriate orders placed.  Jermaine Martin was informed that the remainder of the evaluation will be completed by another provider, this initial triage assessment does not replace that evaluation, and the importance of remaining in the ED until their evaluation is complete.     Jermaine Lodge, PA-C 06/03/20 1350    Jermaine Core, MD 06/03/20 [phone]

## 2020-06-03 NOTE — ED Provider Notes (Signed)
[facility] EMERGENCY DEPARTMENT Provider Note   CSN: [id number] Arrival date & time: 06/03/20  1315     History No chief complaint on file.   Jermaine Martin is a 55 y.o. male.  HPI    Patient presents with concern of right lower quadrant abdominal pain, nausea, retching. Patient is generally well, has no history of abdominal surgery, no history of kidney or bladder issues. Onset was within the past 15 hours.  No clear precipitant. Initially pain was severe, but it is improved somewhat without clear intervention.  Pain is focal, minimally radiating, sharp. There is been persistent and anorexia, and retching, but no additional vomiting after onset.  History reviewed. No pertinent past medical history.  There are no problems to display for this patient.   History reviewed. No pertinent surgical history.     History reviewed. No pertinent family history.  Social History   Tobacco Use  . Smoking status: Never Smoker  . Smokeless tobacco: Never Used  Substance Use Topics  . Alcohol use: Yes    Comment: occas.  . Drug use: No    Home Medications Prior to Admission medications   Medication Sig Start Date End Date Taking? Authorizing Provider  HYDROcodone-acetaminophen (NORCO/VICODIN) 5-325 MG tablet Take 1 tablet by mouth every 6 (six) hours as needed. 05/21/16   Vivi Barrack, DPM    Allergies    Patient has no known allergies.  Review of Systems   Review of Systems  Constitutional:       Per HPI, otherwise negative  HENT:       Per HPI, otherwise negative  Respiratory:       Per HPI, otherwise negative  Cardiovascular:       Per HPI, otherwise negative  Gastrointestinal: Positive for abdominal pain, nausea and vomiting.  Endocrine:       Negative aside from HPI  Genitourinary:       Neg aside from HPI   Musculoskeletal:       Per HPI, otherwise negative  Skin: Negative.   Neurological: Negative for syncope.    Physical  Exam Updated Vital Signs BP (!) 166/92 (BP Location: Left Arm)   Pulse 83   Temp 98.4 F (36.9 C) (Oral)   Resp 18   SpO2 100%   Physical Exam Vitals and nursing note reviewed.  Constitutional:      General: He is not in acute distress.    Appearance: He is well-developed.  HENT:     Head: Normocephalic and atraumatic.  Eyes:     Conjunctiva/sclera: Conjunctivae normal.  Cardiovascular:     Rate and Rhythm: Normal rate and regular rhythm.  Pulmonary:     Effort: Pulmonary effort is normal. No respiratory distress.     Breath sounds: No stridor.  Abdominal:     General: There is no distension.     Tenderness: There is abdominal tenderness.  Skin:    General: Skin is warm and dry.  Neurological:     Mental Status: He is alert and oriented to person, place, and time.     ED Results / Procedures / Treatments   Labs (all labs ordered are listed, but only abnormal results are displayed) Labs Reviewed  COMPREHENSIVE METABOLIC PANEL - Abnormal; Notable for the following components:      Result Value   Glucose, Bld 139 (*)    Creatinine, Ser 1.39 (*)    All other components within normal limits  CBC WITH DIFFERENTIAL/PLATELET -  Abnormal; Notable for the following components:   Platelets 146 (*)    All other components within normal limits  URINALYSIS, ROUTINE W REFLEX MICROSCOPIC - Abnormal; Notable for the following components:   Ketones, ur 20 (*)    All other components within normal limits  LIPASE, BLOOD    EKG None  Radiology CT Abdomen Pelvis W Contrast  Result Date: 06/03/2020 CLINICAL DATA:  RIGHT lower quadrant pain beginning today, associated nausea and dry heaving, question appendicitis EXAM: CT ABDOMEN AND PELVIS WITH CONTRAST TECHNIQUE: Multidetector CT imaging of the abdomen and pelvis was performed using the standard protocol following bolus administration of intravenous contrast. Sagittal and coronal MPR images reconstructed from axial data set.  CONTRAST:  60mL OMNIPAQUE IOHEXOL 300 MG/ML SOLN IV. No oral contrast. COMPARISON:  None FINDINGS: Lower chest: Lung bases clear Hepatobiliary: Gallbladder and liver normal appearance Pancreas: Normal appearance Spleen: Normal appearance Adrenals/Urinary Tract: Adrenal glands normal appearance. Minimal RIGHT renal enlargement with delay in RIGHT nephrogram and extensive perinephric edema. Dilated RIGHT ureter extending to a 2 mm calculus at the ureterovesical junction. Small cyst and tiny nonobstructing calculus LEFT kidney. No LEFT hydronephrosis or ureteral dilatation. Bladder otherwise unremarkable. Stomach/Bowel: Normal appendix. Stomach and bowel loops normal appearance. Vascular/Lymphatic: Vascular structures patent.  No adenopathy. Reproductive: Unremarkable prostate gland and seminal vesicles Other: No free air or free fluid.  No hernia. Musculoskeletal: Unremarkable IMPRESSION: RIGHT hydronephrosis and hydroureter secondary to a 2 mm RIGHT ureterovesical junction calculus, with associated delay in RIGHT nephrogram and significant RIGHT perinephric edema. Additional tiny nonobstructing LEFT renal calculus and small LEFT renal cyst. Normal appendix. Remainder of exam unremarkable. Electronically Signed   By: Ulyses Southward M.D.   On: 06/03/2020 19:16    Procedures Procedures   Medications Ordered in ED Medications  oxyCODONE-acetaminophen (PERCOCET/ROXICET) 5-325 MG per tablet 1 tablet (1 tablet Oral Given 06/03/20 1353)  ondansetron (ZOFRAN-ODT) disintegrating tablet 4 mg (4 mg Oral Given 06/03/20 1353)  iohexol (OMNIPAQUE) 300 MG/ML solution 75 mL (75 mLs Intravenous Contrast Given 06/03/20 1850)  sodium chloride 0.9 % bolus 1,000 mL (1,000 mLs Intravenous New Bag/Given 06/03/20 2116)  ketorolac (TORADOL) 30 MG/ML injection 30 mg (30 mg Intravenous Given 06/03/20 2116)    ED Course  I have reviewed the triage vital signs and the nursing notes.  Pertinent labs & imaging results that were available  during my care of the patient were reviewed by me and considered in my medical decision making (see chart for details).   After the initial evaluation I reviewed the patient's CT images and then demonstrated them to him in the room, specifically illustrating the presence of a right-sided kidney stone.  10:20 PM Patient smiling, has been tolerant of oral intake, feels better. He and I again discussed findings, kidney stone, slight dehydration reflected with elevated creatinine, ketone urea.  No evidence for concurrent infection, complete obstruction.  Patient amenable to, appropriate for discharge with outpatient urology follow-up.  Final Clinical Impression(s) / ED Diagnoses Final diagnoses:  Nephrolithiasis    Rx / DC Orders ED Discharge Orders         Ordered    HYDROcodone-acetaminophen (NORCO/VICODIN) 5-325 MG tablet  Every 6 hours PRN        06/03/20 2223    ondansetron (ZOFRAN ODT) 4 MG disintegrating tablet  Every 8 hours PRN        06/03/20 2223    tamsulosin (FLOMAX) 0.4 MG CAPS capsule  Daily        06/03/20  2223           Gerhard Munch, MD 06/03/20 2224

## 2020-06-03 NOTE — Discharge Instructions (Signed)
In addition to the prescribed medication, please use ibuprofen, 400 mg, 3 times daily for additional relief.  Follow-up with our urology colleagues or return here for concerning changes in your condition.

## 2020-06-03 NOTE — ED Triage Notes (Signed)
Pt here from work with c/o right lower side abds pain  Along with some vomiting

## 2022-05-03 IMAGING — CT CT ABD-PELV W/ CM
2 of 5 series · 17 of 46 positions shown, 19 images · IV contrast (Omni 300)
Comparison: None

CLINICAL DATA: RIGHT lower quadrant pain beginning today,
associated nausea and dry heaving, question appendicitis

EXAM:
CT ABDOMEN AND PELVIS WITH CONTRAST
TECHNIQUE: Multidetector CT imaging of the abdomen and pelvis was performed
using the standard protocol following bolus administration of
intravenous contrast. Sagittal and coronal MPR images reconstructed
from axial data set.
CONTRAST:  75mL OMNIPAQUE IOHEXOL 300 MG/ML SOLN IV. No oral
contrast.

[Series 3: a/p w/ 5mm · axial · 0.96mm/px · z∈[+765,+1215]mm · 14 of 101 slices shown, 16 images]
[im 6/101  soft-tissue]
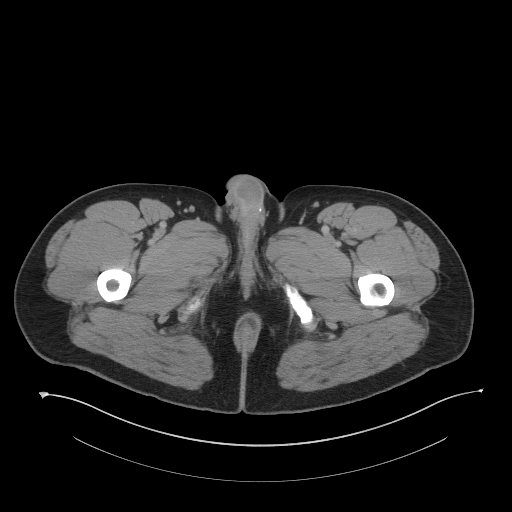
[im 6/101  bone]
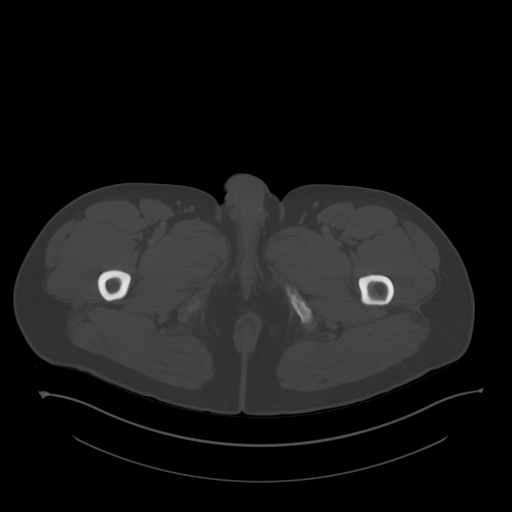
[im 16/101  soft-tissue]
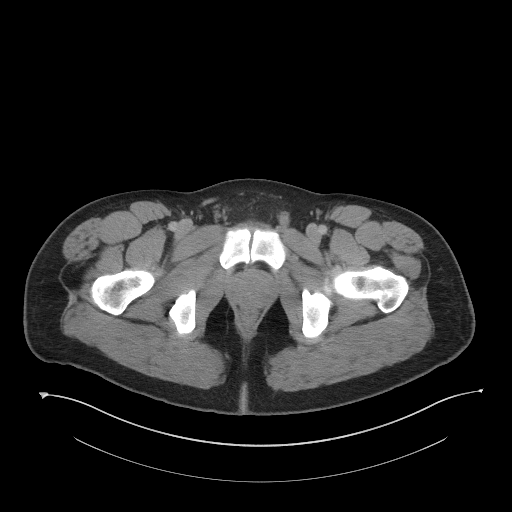
[im 21/101  soft-tissue]
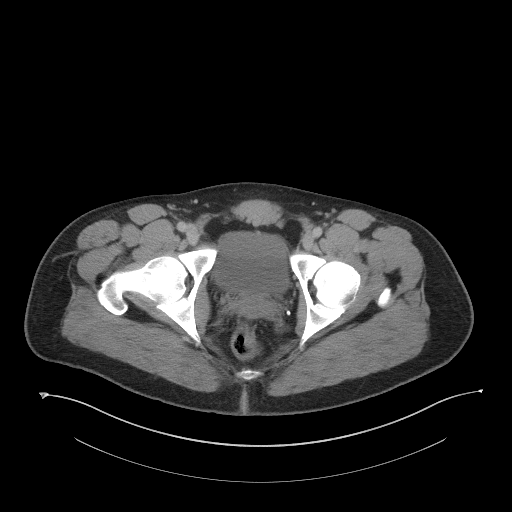
[im 26/101  soft-tissue]
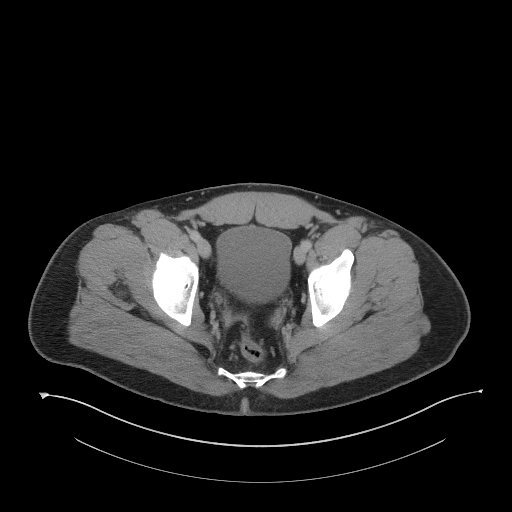
[im 36/101  soft-tissue]
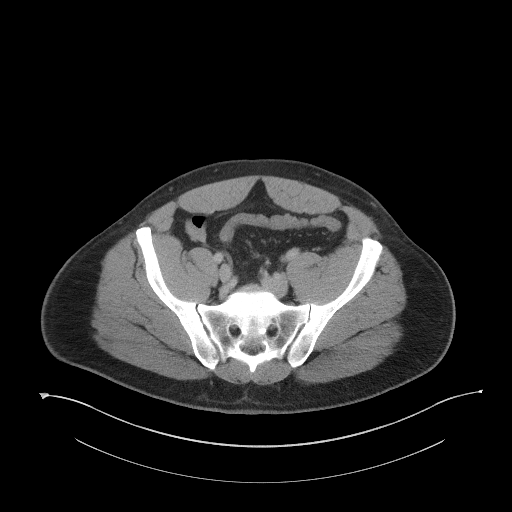
[im 41/101  soft-tissue]
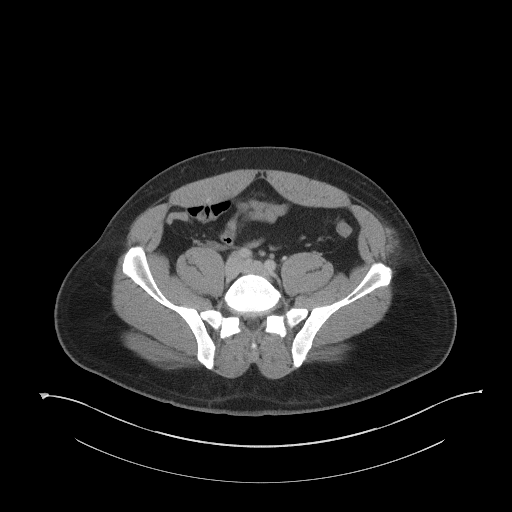
[im 46/101  soft-tissue]
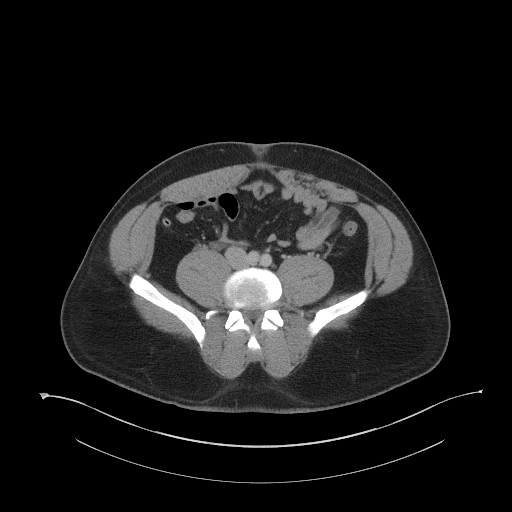
[im 56/101  soft-tissue]
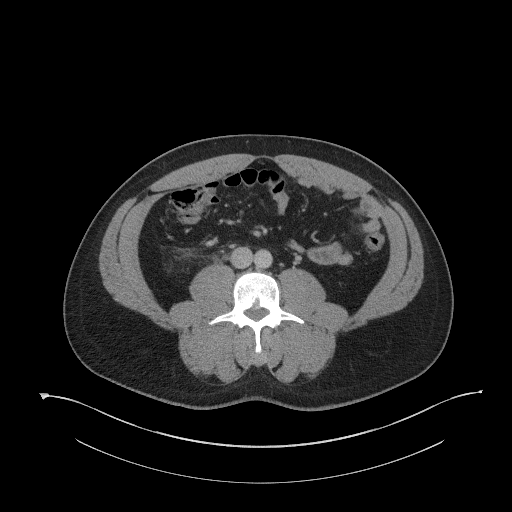
[im 61/101  soft-tissue]
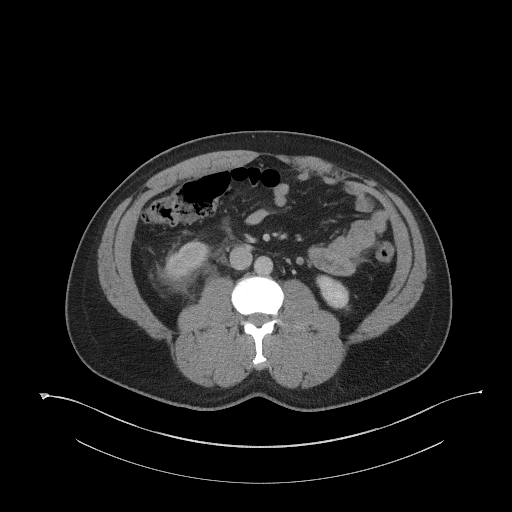
[im 61/101  bone]
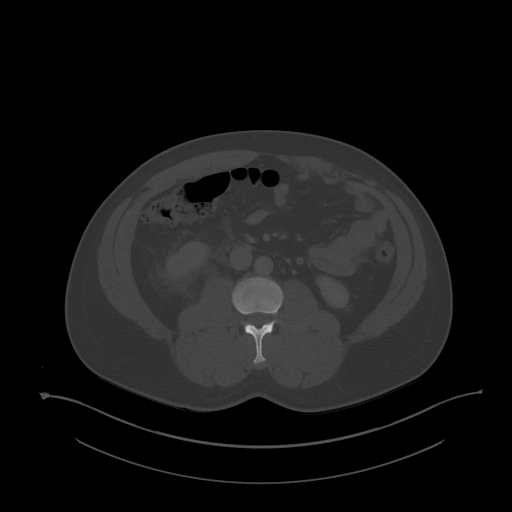
[im 66/101  soft-tissue]
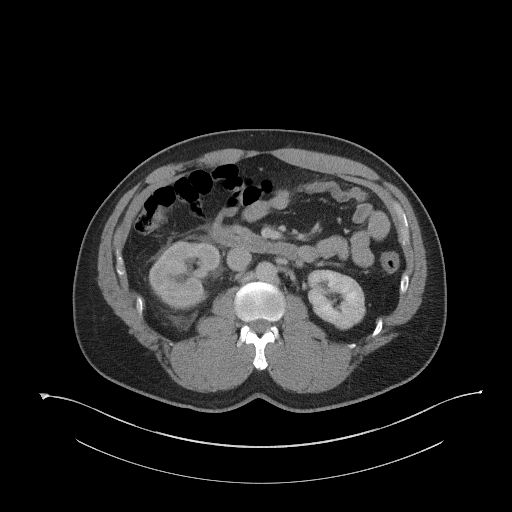
[im 76/101  soft-tissue]
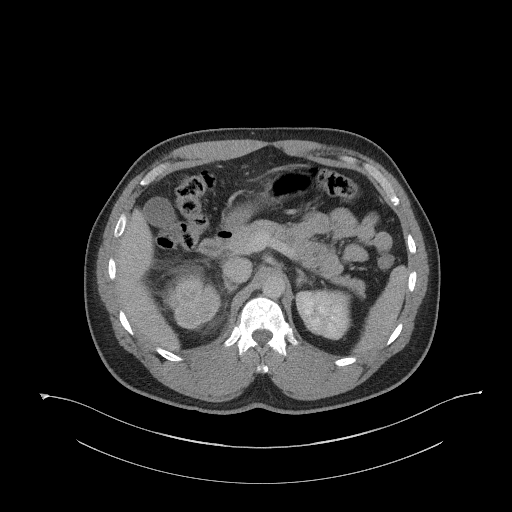
[im 81/101  soft-tissue]
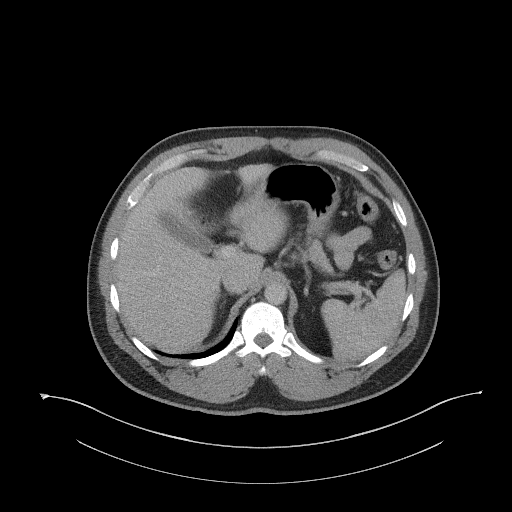
[im 86/101  soft-tissue]
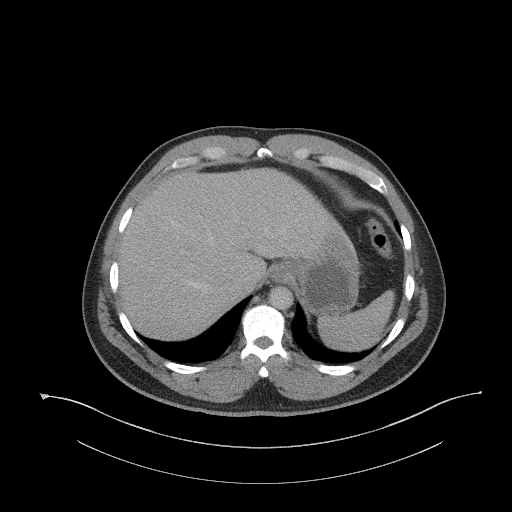
[im 96/101  soft-tissue]
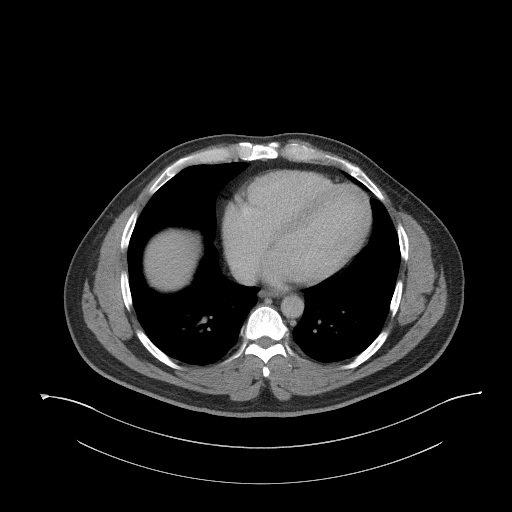

[Series 6: a/p w/ cor · coronal · 0.93mm/px · 3 of 150 slices shown]
[im 50/150  soft-tissue]
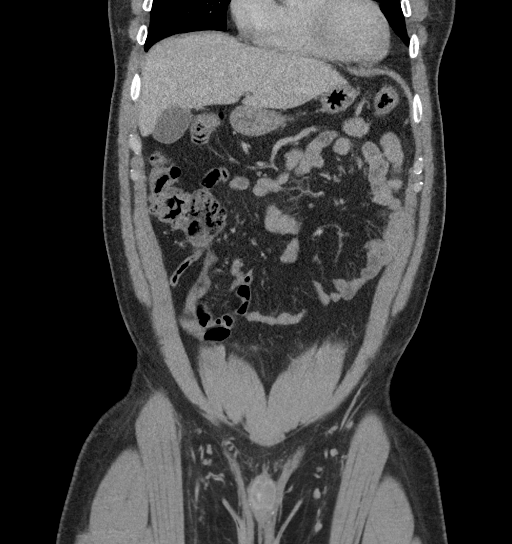
[im 67/150  soft-tissue]
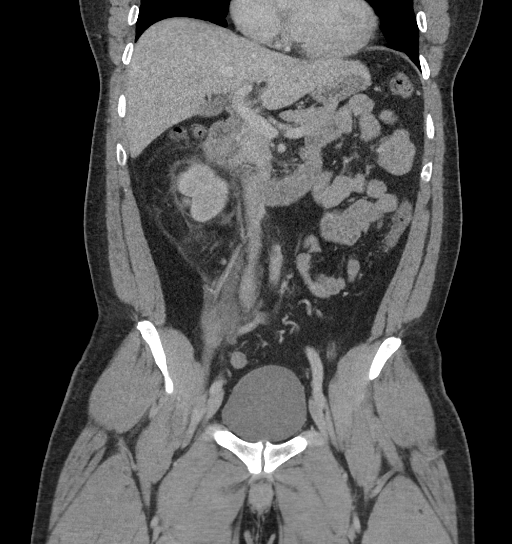
[im 83/150  soft-tissue]
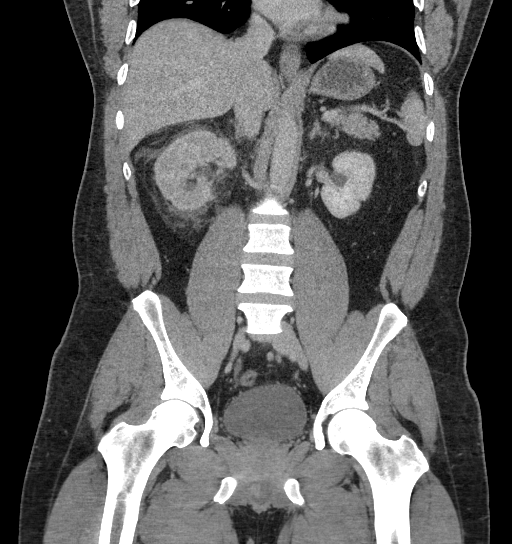

[17 of 46 positions shown; findings below may reference images not displayed]

FINDINGS: Lower chest: Lung bases clear

Hepatobiliary: Gallbladder and liver normal appearance

Pancreas: Normal appearance

Spleen: Normal appearance

Adrenals/Urinary Tract: Adrenal glands normal appearance. Minimal
RIGHT renal enlargement with delay in RIGHT nephrogram and extensive
perinephric edema. Dilated RIGHT ureter extending to a 2 mm calculus
at the ureterovesical junction. Small cyst and tiny nonobstructing
calculus LEFT kidney. No LEFT hydronephrosis or ureteral dilatation.
Bladder otherwise unremarkable.

Stomach/Bowel: Normal appendix. Stomach and bowel loops normal
appearance.

Vascular/Lymphatic: Vascular structures patent.  No adenopathy.

Reproductive: Unremarkable prostate gland and seminal vesicles

Other: No free air or free fluid.  No hernia.

Musculoskeletal: Unremarkable
IMPRESSION: RIGHT hydronephrosis and hydroureter secondary to a 2 mm RIGHT
ureterovesical junction calculus, with associated delay in RIGHT
nephrogram and significant RIGHT perinephric edema.

Additional tiny nonobstructing LEFT renal calculus and small LEFT
renal cyst.

Normal appendix.

Remainder of exam unremarkable.

## 2022-11-06 ENCOUNTER — Other Ambulatory Visit (HOSPITAL_COMMUNITY): Payer: Self-pay | Admitting: Family Medicine

## 2022-11-06 DIAGNOSIS — E785 Hyperlipidemia, unspecified: Secondary | ICD-10-CM

## 2022-12-04 ENCOUNTER — Ambulatory Visit (HOSPITAL_COMMUNITY)
Admission: RE | Admit: 2022-12-04 | Discharge: 2022-12-04 | Disposition: A | Discharge status: Home / Self Care | Payer: 59 | Source: Ambulatory Visit | Attending: Family Medicine | Admitting: Family Medicine

## 2022-12-04 DIAGNOSIS — E785 Hyperlipidemia, unspecified: Secondary | ICD-10-CM | POA: Insufficient documentation
